# Patient Record
Sex: Male | Born: 1985
Health system: Southern US, Community
[De-identification: ages and names within clinical notes are randomized; demographics above are authoritative.]

## PROBLEM LIST (undated history)

## (undated) DIAGNOSIS — J45909 Unspecified asthma, uncomplicated: Secondary | ICD-10-CM

## (undated) HISTORY — PX: NO PAST SURGERIES: SHX2092

---
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
## 2003-08-11 ENCOUNTER — Emergency Department (HOSPITAL_COMMUNITY): Admission: AD | Admit: 2003-08-11 | Discharge: 2003-08-12 | Payer: Self-pay | Admitting: Emergency Medicine

## 2003-12-13 ENCOUNTER — Emergency Department (HOSPITAL_COMMUNITY): Admission: EM | Admit: 2003-12-13 | Discharge: 2003-12-13 | Payer: Self-pay | Admitting: Emergency Medicine
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
## 2012-12-13 ENCOUNTER — Encounter (HOSPITAL_COMMUNITY): Payer: Self-pay | Admitting: Emergency Medicine

## 2012-12-13 ENCOUNTER — Emergency Department (INDEPENDENT_AMBULATORY_CARE_PROVIDER_SITE_OTHER)
Admission: EM | Admit: 2012-12-13 | Discharge: 2012-12-13 | Disposition: A | Discharge status: Home / Self Care | Payer: Self-pay | Source: Home / Self Care | Attending: Emergency Medicine | Admitting: Emergency Medicine

## 2012-12-13 ENCOUNTER — Other Ambulatory Visit (HOSPITAL_COMMUNITY)
Admission: RE | Admit: 2012-12-13 | Discharge: 2012-12-13 | Disposition: A | Discharge status: Home / Self Care | Payer: Self-pay | Source: Ambulatory Visit | Attending: Emergency Medicine | Admitting: Emergency Medicine

## 2012-12-13 DIAGNOSIS — R319 Hematuria, unspecified: Secondary | ICD-10-CM

## 2012-12-13 DIAGNOSIS — N342 Other urethritis: Secondary | ICD-10-CM

## 2012-12-13 DIAGNOSIS — Z113 Encounter for screening for infections with a predominantly sexual mode of transmission: Secondary | ICD-10-CM | POA: Insufficient documentation

## 2012-12-13 LAB — POCT URINALYSIS DIP (DEVICE)
Bilirubin Urine: NEGATIVE
Glucose, UA: NEGATIVE mg/dL
Hgb urine dipstick: NEGATIVE
Ketones, ur: NEGATIVE mg/dL
Leukocytes, UA: NEGATIVE
Nitrite: NEGATIVE
pH: 7 (ref 5.0–8.0)

## 2012-12-13 MED ORDER — CEFTRIAXONE SODIUM 250 MG IJ SOLR
250.0000 mg | Freq: Once | INTRAMUSCULAR | Status: AC
  Administered 2012-12-13: 250 mg via INTRAMUSCULAR

## 2012-12-13 MED ORDER — CEFTRIAXONE SODIUM 250 MG IJ SOLR
INTRAMUSCULAR | Status: AC
  Filled 2012-12-13: qty 250

## 2012-12-13 MED ORDER — LIDOCAINE HCL (PF) 1 % IJ SOLN
INTRAMUSCULAR | Status: AC
  Filled 2012-12-13: qty 5

## 2012-12-13 MED ORDER — AZITHROMYCIN 250 MG PO TABS
ORAL_TABLET | ORAL | Status: AC
  Filled 2012-12-13: qty 4

## 2012-12-13 MED ORDER — AZITHROMYCIN 250 MG PO TABS
1000.0000 mg | ORAL_TABLET | Freq: Once | ORAL | Status: AC
  Administered 2012-12-13: 1000 mg via ORAL

## 2012-12-13 NOTE — ED Notes (Signed)
Patient aware of post injection delay prior to discharge.  Declined crackers and soft drinks, currently has water

## 2012-12-13 NOTE — ED Provider Notes (Signed)
Chief Complaint:   Chief Complaint  Patient presents with  . Hematuria    History of Present Illness:   Richard Salinas is a 27 year old male who has had a two-week history of intermittent blood at the tip of the urethra after urination. He's had slight burning and slight urgency. He denies any urethral discharge , frequency, or changes in his urinary stream. He's had no fever, chills, nausea, vomiting, abdominal pain, or lower back pain. He denies any testicular pain or swelling. The patient states he was at the health department 2 weeks ago and had STD testing all which was negative and he was not given any treatment at the time. He denies any history of urinary tract infections, stones, or STDs.   Review of Systems:  Other than noted above, the patient denies any of the following symptoms: General:  No fevers, chills, sweats, aches, or fatigue. GI:  No abdominal pain, back pain, nausea, vomiting, diarrhea, or constipation. GU:  No dysuria, frequency, urgency, hematuria, urethral discharge, penile lesions, penile pain, testicular pain, swelling, or mass, inguinal lymphadenopathy or incontinence.  PMFSH:  Past medical history, family history, social history, meds, and allergies were reviewed.    Physical Exam:   Vital signs:  BP 118/72  Pulse 74  Temp(Src) 98.3 F (36.8 C) (Oral)  Resp 17  SpO2 96% Gen:  Alert, oriented, in no distress. Lungs:  Clear to auscultation, no wheezes, rales or rhonchi. Heart:  Regular rhythm, no gallop or murmer. Abdomen:  Flat and soft.  No tenderness to palpation, guarding, or rebound.  No hepato-splenomegaly or mass.  Bowel sounds were normally active.  No hernia. Genital exam:   There is a yellowish urethral discharge. No lesions on the penis. Testes are normal, no inguinal lymphadenopathy.  Rectal exam:   No masses, normal prostate, no nodules, and heme-negative stool.  Back:  No CVA tenderness.  Skin:  Clear, warm and dry.  Labs:   Results for orders  placed during the hospital encounter of 12/13/12  POCT URINALYSIS DIP (DEVICE)      Result Value Range   Glucose, UA NEGATIVE  NEGATIVE mg/dL   Bilirubin Urine NEGATIVE  NEGATIVE   Ketones, ur NEGATIVE  NEGATIVE mg/dL   Specific Gravity, Urine 1.025  1.005 - 1.030   Hgb urine dipstick NEGATIVE  NEGATIVE   pH 7.0  5.0 - 8.0   Protein, ur NEGATIVE  NEGATIVE mg/dL   Urobilinogen, UA 2.0 (*) 0.0 - 1.0 mg/dL   Nitrite NEGATIVE  NEGATIVE   Leukocytes, UA NEGATIVE  NEGATIVE    Also obtained a urine culture, and DNA probes for gonorrhea, Chlamydia, Trichomonas.   Course in Urgent Care Center:   Given Rocephin 250 mg IM, and azithromycin 1000 mg by mouth.  Assessment: The primary encounter diagnosis was Hematuria. A diagnosis of Urethritis was also pertinent to this visit.    Even though he was at the health department and had negative STD testing 2 weeks ago, it appears he has gonorrhea. This may be the cause of his hematuria. I've advised him to followup with urology for further evaluation of hematuria in 2 weeks.   Plan:   1.  The following meds were prescribed:   New Prescriptions   No medications on file   2.  The patient was instructed in symptomatic care and handouts were given. 3.  The patient was told to return if becoming worse in any way. 4.  Follow up with Dr. Sebastian Ache in 2 weeks.  Reuben Likes, MD 12/13/12 [phone]

## 2012-12-13 NOTE — ED Notes (Signed)
Reports blood in urine.  Burning with urination.  Symptoms started 2-3 weeks ago.

## 2012-12-13 NOTE — ED Notes (Signed)
Not ready for discharge, post injection delay prior to discharge

## 2012-12-13 NOTE — ED Notes (Signed)
Triage process interrupted by department issue

## 2012-12-14 LAB — URINE CULTURE
Colony Count: NO GROWTH
Culture: NO GROWTH

## 2012-12-16 ENCOUNTER — Telehealth (HOSPITAL_COMMUNITY): Payer: Self-pay | Admitting: Emergency Medicine

## 2012-12-16 ENCOUNTER — Telehealth (HOSPITAL_COMMUNITY): Payer: Self-pay | Admitting: *Deleted

## 2012-12-16 MED ORDER — METRONIDAZOLE 500 MG PO TABS: ORAL_TABLET | ORAL | Status: DC

## 2012-12-16 NOTE — ED Notes (Signed)
GC/Chlamydia neg., Trich pos.  Order for Flagyl obtained.  I called pt. Pt. verified x 2 and given results.  Pt. told he needs Flagyl for Trich.  Pt. instructed to notify his partner to be treated with Flagyl, no sex until you have finished your medication and your partner has been treated and to practice safe sex. You can get HIV testing at the [facility] STD clinic, by appointment.  Pt. said he has already done that. Pt. wants Rx. called to [facility] on [address]. I called Rx. to pharmacy voicemail @ [phone]. Richard Salinas 12/16/2012

## 2012-12-16 NOTE — ED Notes (Signed)
DNA probe came back positive for Trichomonas. Will treat with metronidazole 500 mg, 2 twice a day for one day.  Reuben Likes, MD 12/16/12 [phone]

## 2012-12-16 NOTE — Telephone Encounter (Signed)
Message copied by Reuben Likes on Tue Dec 16, 2012  5:24 PM ------      Message from: Vassie Moselle      Created: Tue Dec 16, 2012  5:02 PM      Regarding: lab       Trich pos. Rest of labs neg.  Needs order.      Vassie Moselle      12/16/2012       ------

## 2012-12-17 ENCOUNTER — Telehealth (HOSPITAL_COMMUNITY): Payer: Self-pay | Admitting: *Deleted

## 2012-12-17 NOTE — ED Notes (Signed)
Pt. called back and said the he called [facility] @ 0800 and 1330 and they did not have the Rx. He wants to it called to the [facility] on [address]. I told him it should be ready in 1 hr.  I called [facility] to verify.  The pharmacist said it was not on her VM this morning.  Rx. called to the [facility] pharmacist @ [phone]. Vassie Moselle 12/17/2012

## 2012-12-18 NOTE — ED Notes (Signed)
Chart review.

## 2017-07-25 ENCOUNTER — Encounter (HOSPITAL_COMMUNITY): Payer: Self-pay | Admitting: Family Medicine

## 2017-07-25 ENCOUNTER — Ambulatory Visit (HOSPITAL_COMMUNITY)
Admission: EM | Admit: 2017-07-25 | Discharge: 2017-07-25 | Disposition: A | Discharge status: Home / Self Care | Payer: Self-pay | Attending: Family Medicine | Admitting: Family Medicine

## 2017-07-25 ENCOUNTER — Ambulatory Visit (INDEPENDENT_AMBULATORY_CARE_PROVIDER_SITE_OTHER): Payer: Self-pay

## 2017-07-25 DIAGNOSIS — M79671 Pain in right foot: Secondary | ICD-10-CM

## 2017-07-25 DIAGNOSIS — S92351D Displaced fracture of fifth metatarsal bone, right foot, subsequent encounter for fracture with routine healing: Secondary | ICD-10-CM

## 2017-07-25 MED ORDER — TRAMADOL HCL 50 MG PO TABS: 50.0000 mg | ORAL_TABLET | Freq: Four times a day (QID) | ORAL | 0 refills | Status: DC | PRN

## 2017-07-25 NOTE — ED Provider Notes (Signed)
[facility] CARE [facility]ENTER   [id number] 07/25/17 Arrival Time: 1350   SUBJECTIVE:  [name] is a 32 y.o. male who presents to the urgent care with complaint of pain in right foot that started about 30 minutes ago. He was playing with his dog and felt a pop. Able to ambulate but pain with walking. sts lateral foot pain.   History reviewed. No pertinent past medical history. History reviewed. No pertinent family history. Social History   Socioeconomic History  . Marital status: Single    Spouse name: Not on file  . Number of children: Not on file  . Years of education: Not on file  . Highest education level: Not on file  Social Needs  . Financial resource strain: Not on file  . Food insecurity - worry: Not on file  . Food insecurity - inability: Not on file  . Transportation needs - medical: Not on file  . Transportation needs - non-medical: Not on file  Occupational History  . Not on file  Tobacco Use  . Smoking status: Current Every Day Smoker  Substance and Sexual Activity  . Alcohol use: Yes  . Drug use: No  . Sexual activity: Not on file  Other Topics Concern  . Not on file  Social History Narrative  . Not on file   No outpatient medications have been marked as taking for the 07/25/17 encounter [facility] Encounter).   No Known Allergies    ROS: As per HPI, remainder of ROS negative.   OBJECTIVE:   Vitals:   07/25/17 1412  BP: 102/70  Pulse: 80  Resp: 18  Temp: 98.6 F (37 C)  SpO2: 97%     General appearance: alert; no distress Eyes: PERRL; EOMI; conjunctiva normal HENT: normocephalic; atraumatic; TMs normal, canal normal, external ears normal without trauma; nasal mucosa normal; oral mucosa normal Neck: supple Back: no CVA tenderness Extremities: no cyanosis or edema; swollen right proximal 5th metatarsal. Skin: warm and dry Neurologic: normal gait; grossly normal Psychological: alert and cooperative; normal mood and  affect      Labs:  Results for orders placed or performed during the hospital encounter of 12/13/12  Urine culture  Result Value Ref Range   Specimen Description URINE, CLEAN CATCH    Special Requests NONE    Culture  Setup Time 12/13/2012 21:32    Colony Count NO GROWTH    Culture NO GROWTH    Report Status 12/14/2012 FINAL   POCT urinalysis dip (device)  Result Value Ref Range   Glucose, UA NEGATIVE NEGATIVE mg/dL   Bilirubin Urine NEGATIVE NEGATIVE   Ketones, ur NEGATIVE NEGATIVE mg/dL   Specific Gravity, Urine 1.025 1.005 - 1.030   Hgb urine dipstick NEGATIVE NEGATIVE   pH 7.0 5.0 - 8.0   Protein, ur NEGATIVE NEGATIVE mg/dL   Urobilinogen, UA 2.0 (H) 0.0 - 1.0 mg/dL   Nitrite NEGATIVE NEGATIVE   Leukocytes, UA NEGATIVE NEGATIVE  Occult blood, poc device  Result Value Ref Range   Fecal Occult Bld NEGATIVE NEGATIVE    Labs Reviewed - No data to display  Dg Foot Complete Right  Result Date: 07/25/2017 CLINICAL DATA:  32 y/o M; lateral foot swelling after a popping sensation at base of fourth and fifth metatarsals. EXAM: RIGHT FOOT COMPLETE - 3+ VIEW COMPARISON:  None. FINDINGS: Slight cortical irregularity in the lateral base of fifth metatarsal may represent a nondisplaced fracture. Overlying soft tissue swelling. No other fracture or dislocation identified. Pes planus. IMPRESSION: 1. Slight cortical  irregularity and lateral base of fifth metatarsal may represent nondisplaced fracture. 2. Pes planus. Electronically Signed   By: Mitzi HansenLance  Furusawa-Stratton M.D.   On: 07/25/2017 14:50       ASSESSMENT & PLAN:  1. Closed fracture of base of fifth metatarsal bone with routine healing, right     Meds ordered this encounter  Medications  . traMADol (ULTRAM) 50 MG tablet    Sig: Take 1 tablet (50 mg total) by mouth every 6 (six) hours as needed.    Dispense:  15 tablet    Refill:  0    Reviewed expectations re: course of current medical issues. Questions  answered. Outlined signs and symptoms indicating need for more acute intervention. Patient verbalized understanding. After Visit Summary given.      Elvina SidleLauenstein, Adhira Jamil, MD 07/25/17 [phone]

## 2017-07-25 NOTE — Discharge Instructions (Signed)
You will need orthopedic follow up in 4-5 days.

## 2017-07-25 NOTE — ED Triage Notes (Signed)
Pt here for pain in right foot that started about 30 minutes ago. He was playing with his dog and felt a pop. Able to ambulate but pain with walking. sts lateral foot pain.

## 2018-08-06 ENCOUNTER — Emergency Department (HOSPITAL_COMMUNITY)
Admission: EM | Admit: 2018-08-06 | Discharge: 2018-08-06 | Disposition: A | Discharge status: Home / Self Care | Payer: BLUE CROSS/BLUE SHIELD | Attending: Emergency Medicine | Admitting: Emergency Medicine

## 2018-08-06 ENCOUNTER — Other Ambulatory Visit: Payer: Self-pay

## 2018-08-06 ENCOUNTER — Encounter (HOSPITAL_COMMUNITY): Payer: Self-pay

## 2018-08-06 DIAGNOSIS — R197 Diarrhea, unspecified: Secondary | ICD-10-CM | POA: Diagnosis not present

## 2018-08-06 DIAGNOSIS — J45909 Unspecified asthma, uncomplicated: Secondary | ICD-10-CM | POA: Insufficient documentation

## 2018-08-06 DIAGNOSIS — R05 Cough: Secondary | ICD-10-CM | POA: Diagnosis not present

## 2018-08-06 DIAGNOSIS — F17200 Nicotine dependence, unspecified, uncomplicated: Secondary | ICD-10-CM | POA: Diagnosis not present

## 2018-08-06 DIAGNOSIS — R059 Cough, unspecified: Secondary | ICD-10-CM

## 2018-08-06 HISTORY — DX: Unspecified asthma, uncomplicated: J45.909

## 2018-08-06 MED ORDER — BENZONATATE 100 MG PO CAPS: 100.0000 mg | ORAL_CAPSULE | Freq: Three times a day (TID) | ORAL | 0 refills | Status: DC | PRN

## 2018-08-06 NOTE — ED Notes (Signed)
Patient verbalizes understanding of medications and discharge instructions. No further questions at this time. VSS and patient ambulatory at discharge.   

## 2018-08-06 NOTE — ED Provider Notes (Signed)
[facility] EMERGENCY DEPARTMENT Provider Note   CSN: [id number] Arrival date & time: 08/06/18  2033    History   Chief Complaint Chief Complaint  Patient presents with  . Cough    HPI Richard Salinas is a 33 y.o. male with history of asthma presenting for evaluation of acute onset, persistent cough and diarrhea for 1 week.  He reports that his 51-month-old daughter has similar symptoms that began at the same time.  He denies any fevers or chills, chest pain, shortness of breath, abdominal pain, nausea, vomiting, urinary symptoms, sore throat, or nasal congestion.  Cough is sometimes productive of clear sputum.  Has had 1-3 episodes of softer stools but no melena or hematochezia.  He currently smokes 1 black and mild cigar daily.  Denies recreational drug use otherwise.  Has not tried anything for his symptoms.  He denies any recent travel or direct exposure with someone who tested positive for COVID-19.  He does work at [employer].     The history is provided by the patient.    Past Medical History:  Diagnosis Date  . Asthma     There are no active problems to display for this patient.   History reviewed. No pertinent surgical history.      Home Medications    Prior to Admission medications   Medication Sig Start Date End Date Taking? Authorizing Provider  benzonatate (TESSALON) 100 MG capsule Take 1 capsule (100 mg total) by mouth 3 (three) times daily as needed for cough. 08/06/18   Giulliana Mcroberts A, PA-C  traMADol (ULTRAM) 50 MG tablet Take 1 tablet (50 mg total) by mouth every 6 (six) hours as needed. 07/25/17   Elvina Sidle, MD    Family History No family history on file.  Social History Social History   Tobacco Use  . Smoking status: Current Every Day Smoker  Substance Use Topics  . Alcohol use: Yes  . Drug use: No     Allergies   Patient has no known allergies.   Review of Systems Review of Systems   Constitutional: Negative for chills and fever.  HENT: Negative for congestion and sore throat.   Respiratory: Positive for cough. Negative for shortness of breath.   Cardiovascular: Negative for chest pain.  Gastrointestinal: Positive for diarrhea. Negative for abdominal pain, blood in stool, nausea and vomiting.  All other systems reviewed and are negative.    Physical Exam Updated Vital Signs BP 115/74 (BP Location: Right Arm)   Pulse 92   Temp 98.7 F (37.1 C) (Oral)   Resp 18   Ht 6\' 1"  (1.854 m)   Wt 70.3 kg   SpO2 97%   BMI 20.45 kg/m   Physical Exam Vitals signs and nursing note reviewed.  Constitutional:      General: He is not in acute distress.    Appearance: He is well-developed.     Comments: Resting comfortably in bed  HENT:     Head: Normocephalic and atraumatic.     Right Ear: Tympanic membrane, ear canal and external ear normal.     Left Ear: Tympanic membrane, ear canal and external ear normal.     Nose: Nose normal. No congestion or rhinorrhea.     Mouth/Throat:     Mouth: Mucous membranes are moist.     Pharynx: Oropharynx is clear. No oropharyngeal exudate or posterior oropharyngeal erythema.     Comments: No tonsillar hypertrophy, exudates, erythema, trismus or sublingual abnormalities.  Eyes:     General:        Right eye: No discharge.        Left eye: No discharge.     Conjunctiva/sclera: Conjunctivae normal.  Neck:     Musculoskeletal: Normal range of motion and neck supple.     Vascular: No JVD.     Trachea: No tracheal deviation.  Cardiovascular:     Rate and Rhythm: Normal rate and regular rhythm.     Pulses: Normal pulses.     Heart sounds: Normal heart sounds.  Pulmonary:     Effort: Pulmonary effort is normal. No respiratory distress.     Breath sounds: Normal breath sounds. No stridor. No wheezing or rhonchi.     Comments: Speaking in full sentences without difficulty. Abdominal:     General: Abdomen is flat. There is no  distension.     Tenderness: There is no abdominal tenderness. There is no guarding or rebound.  Lymphadenopathy:     Cervical: No cervical adenopathy.  Skin:    General: Skin is warm and dry.     Findings: No erythema.  Neurological:     Mental Status: He is alert.  Psychiatric:        Behavior: Behavior normal.      ED Treatments / Results  Labs (all labs ordered are listed, but only abnormal results are displayed) Labs Reviewed - No data to display  EKG None  Radiology No results found.  Procedures Procedures (including critical care time)  Medications Ordered in ED Medications - No data to display   Initial Impression / Assessment and Plan / ED Course  I have reviewed the triage vital signs and the nursing notes.  Pertinent labs & imaging results that were available during my care of the patient were reviewed by me and considered in my medical decision making (see chart for details).        Patient presenting for evaluation of cough and diarrhea for 1 week.  He is afebrile, vital signs are stable.  He is nontoxic in appearance.  Lungs clear to auscultation bilaterally.  He reports that the cough is minimally productive.  Denies any shortness of breath.  Low suspicion of pneumonia.  Abdomen is soft nontender, doubt acute surgical abdominal pathology. He expresses some concerns that he may be positive for COVID-19, but is overall low risk and does not meet criteria for testing per current [facility] policy.  I recommended self quarantine for the next 14 days and patient is agreeable to this.  We discussed symptomatic management.  Discussed strict ED return precautions. Pt verbalized understanding of and agreement with plan and is safe for discharge home at this time.   Final Clinical Impressions(s) / ED Diagnoses   Final diagnoses:  Cough  Diarrhea of presumed infectious origin    ED Discharge Orders         Ordered    benzonatate (TESSALON) 100 MG capsule  3  times daily PRN     08/06/18 2128           Jeanie Sewer, PA-C 08/06/18 2135    Little, Ambrose Finland, MD 08/07/18 [phone]

## 2018-08-06 NOTE — Discharge Instructions (Addendum)
Drink plenty of water and get plenty of rest.  Use Tessalon as needed for cough.  Avoid smoking.  Follow-up with PCP if symptoms persist.  You should isolate yourself at home for the next 2 weeks.  See the below recommendations.  Return to the emergency department if any concerning signs or symptoms develop such as fevers, shortness of breath, severe cough, abdominal pain, persistent vomiting, or passing out.     Person Under Monitoring Name: Richard Salinas  Location: [address] [place] [address]   Infection Prevention Recommendations for Individuals Confirmed to have, or Being Evaluated for, 2019 Novel Coronavirus (COVID-19) Infection Who Receive Care at Home  Individuals who are confirmed to have, or are being evaluated for, COVID-19 should follow the prevention steps below until a healthcare provider or local or state health department says they can return to normal activities.  Stay home except to get medical care You should restrict activities outside your home, except for getting medical care. Do not go to work, school, or public areas, and do not use public transportation or taxis.  Call ahead before visiting your doctor Before your medical appointment, call the healthcare provider and tell them that you have, or are being evaluated for, COVID-19 infection. This will help the healthcare providers office take steps to keep other people from getting infected. Ask your healthcare provider to call the local or state health department.  Monitor your symptoms Seek prompt medical attention if your illness is worsening (e.g., difficulty breathing). Before going to your medical appointment, call the healthcare provider and tell them that you have, or are being evaluated for, COVID-19 infection. Ask your healthcare provider to call the local or state health department.  Wear a facemask You should wear a facemask that covers your nose and mouth when you are in the same  room with other people and when you visit a healthcare provider. People who live with or visit you should also wear a facemask while they are in the same room with you.  Separate yourself from other people in your home As much as possible, you should stay in a different room from other people in your home. Also, you should use a separate bathroom, if available.  Avoid sharing household items You should not share dishes, drinking glasses, cups, eating utensils, towels, bedding, or other items with other people in your home. After using these items, you should wash them thoroughly with soap and water.  Cover your coughs and sneezes Cover your mouth and nose with a tissue when you cough or sneeze, or you can cough or sneeze into your sleeve. Throw used tissues in a lined trash can, and immediately wash your hands with soap and water for at least 20 seconds or use an alcohol-based hand rub.  Wash your [employer] your hands often and thoroughly with soap and water for at least 20 seconds. You can use an alcohol-based hand sanitizer if soap and water are not available and if your hands are not visibly dirty. Avoid touching your eyes, nose, and mouth with unwashed hands.   Prevention Steps for Caregivers and Household Members of Individuals Confirmed to have, or Being Evaluated for, COVID-19 Infection Being Cared for in the Home  If you live with, or provide care at home for, a person confirmed to have, or being evaluated for, COVID-19 infection please follow these guidelines to prevent infection:  Follow healthcare providers instructions Make sure that you understand and can help the patient  follow any healthcare provider instructions for all care.  Provide for the patients basic needs You should help the patient with basic needs in the home and provide support for getting groceries, prescriptions, and other personal needs.  Monitor the patients symptoms If they are getting sicker,  call his or her medical provider and tell them that the patient has, or is being evaluated for, COVID-19 infection. This will help the healthcare providers office take steps to keep other people from getting infected. Ask the healthcare provider to call the local or state health department.  Limit the number of people who have contact with the patient If possible, have only one caregiver for the patient. Other household members should stay in another home or place of residence. If this is not possible, they should stay in another room, or be separated from the patient as much as possible. Use a separate bathroom, if available. Restrict visitors who do not have an essential need to be in the home.  Keep older adults, very young children, and other sick people away from the patient Keep older adults, very young children, and those who have compromised immune systems or chronic health conditions away from the patient. This includes people with chronic heart, lung, or kidney conditions, diabetes, and cancer.  Ensure good ventilation Make sure that shared spaces in the home have good air flow, such as from an air conditioner or an opened window, weather permitting.  Wash your hands often Wash your hands often and thoroughly with soap and water for at least 20 seconds. You can use an alcohol based hand sanitizer if soap and water are not available and if your hands are not visibly dirty. Avoid touching your eyes, nose, and mouth with unwashed hands. Use disposable paper towels to dry your hands. If not available, use dedicated cloth towels and replace them when they become wet.  Wear a facemask and gloves Wear a disposable facemask at all times in the room and gloves when you touch or have contact with the patients blood, body fluids, and/or secretions or excretions, such as sweat, saliva, sputum, nasal mucus, vomit, urine, or feces.  Ensure the mask fits over your nose and mouth tightly, and do  not touch it during use. Throw out disposable facemasks and gloves after using them. Do not reuse. Wash your hands immediately after removing your facemask and gloves. If your personal clothing becomes contaminated, carefully remove clothing and launder. Wash your hands after handling contaminated clothing. Place all used disposable facemasks, gloves, and other waste in a lined container before disposing them with other household waste. Remove gloves and wash your hands immediately after handling these items.  Do not share dishes, glasses, or other household items with the patient Avoid sharing household items. You should not share dishes, drinking glasses, cups, eating utensils, towels, bedding, or other items with a patient who is confirmed to have, or being evaluated for, COVID-19 infection. After the person uses these items, you should wash them thoroughly with soap and water.  Wash laundry thoroughly Immediately remove and wash clothes or bedding that have blood, body fluids, and/or secretions or excretions, such as sweat, saliva, sputum, nasal mucus, vomit, urine, or feces, on them. Wear gloves when handling laundry from the patient. Read and follow directions on labels of laundry or clothing items and detergent. In general, wash and dry with the warmest temperatures recommended on the label.  Clean all areas the individual has used often Clean all touchable surfaces, such as  counters, tabletops, doorknobs, bathroom fixtures, toilets, phones, keyboards, tablets, and bedside tables, every day. Also, clean any surfaces that may have blood, body fluids, and/or secretions or excretions on them. Wear gloves when cleaning surfaces the patient has come in contact with. Use a diluted bleach solution (e.g., dilute bleach with 1 part bleach and 10 parts water) or a household disinfectant with a label that says EPA-registered for coronaviruses. To make a bleach solution at home, add 1 tablespoon of  bleach to 1 quart (4 cups) of water. For a larger supply, add  cup of bleach to 1 gallon (16 cups) of water. Read labels of cleaning products and follow recommendations provided on product labels. Labels contain instructions for safe and effective use of the cleaning product including precautions you should take when applying the product, such as wearing gloves or eye protection and making sure you have good ventilation during use of the product. Remove gloves and wash hands immediately after cleaning.  Monitor yourself for signs and symptoms of illness Caregivers and household members are considered close contacts, should monitor their health, and will be asked to limit movement outside of the home to the extent possible. Follow the monitoring steps for close contacts listed on the symptom monitoring form.   ? If you have additional questions, contact your local health department or call the epidemiologist on call at [phone] (available 24/7). ? This guidance is subject to change. For the most up-to-date guidance from [facility], please refer to their website: [email or web address]

## 2018-08-06 NOTE — ED Triage Notes (Signed)
Patient reports productive cough and diarrhea x 1 week. 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 Denies nausea, vomiting, chest pain, abdominal pain, dysuria, or fever.

## 2019-02-10 ENCOUNTER — Encounter (HOSPITAL_COMMUNITY): Payer: Self-pay | Admitting: Emergency Medicine

## 2019-02-10 ENCOUNTER — Other Ambulatory Visit: Payer: Self-pay

## 2019-02-10 ENCOUNTER — Ambulatory Visit (HOSPITAL_COMMUNITY)
Admission: EM | Admit: 2019-02-10 | Discharge: 2019-02-10 | Disposition: A | Discharge status: Home / Self Care | Payer: BC Managed Care – PPO | Attending: Family Medicine | Admitting: Family Medicine

## 2019-02-10 DIAGNOSIS — K529 Noninfective gastroenteritis and colitis, unspecified: Secondary | ICD-10-CM

## 2019-02-10 MED ORDER — ONDANSETRON 4 MG PO TBDP: 4.0000 mg | ORAL_TABLET | Freq: Three times a day (TID) | ORAL | 0 refills | Status: DC | PRN

## 2019-02-10 NOTE — ED Provider Notes (Signed)
[facility]   [id number] 02/10/19 Arrival Time: 3235  ASSESSMENT & PLAN:  1. Gastroenteritis     No signs of dehydration requiring IVF at this time. Benign abdominal exam. No indication for urgent abdominal imaging. Discussed.  Meds ordered this encounter  Medications  . ondansetron (ZOFRAN-ODT) 4 MG disintegrating tablet    Sig: Take 1 tablet (4 mg total) by mouth every 8 (eight) hours as needed for nausea or vomiting.    Dispense:  15 tablet    Refill:  0    Overall he reports improvement. Work note provided. Discussed typical duration of symptoms for suspected viral GI illness. Will do his best to ensure adequate fluid intake in order to avoid dehydration. Will proceed to the Emergency Department for evaluation if unable to tolerate PO fluids regularly.  Follow-up Information    [facility].   Specialty: Urgent Care Why: If worsening or failing to improve as anticipated. Contact information: [place] [phone]          Reviewed expectations re: course of current medical issues. Questions answered. Outlined signs and symptoms indicating need for more acute intervention. Patient verbalized understanding. After Visit Summary given.   SUBJECTIVE: History from: patient.  Richard Salinas is a 33 y.o. male who presents with complaint of non-bilious, non-bloody intermittent n/v with non-bloody diarrhea. Abrupt onset approx 48 hours ago. Abdominal discomfort: mild and cramping. Symptoms are gradually improving since beginning. Aggravating factors: eating. Alleviating factors: none identified. Associated symptoms: fatigue. He denies arthralgias, chills, constipation, dysuria, fever, myalgias and sweats. Appetite: decreased. PO intake: decreased but tolerating fluids today. Ambulatory without assistance. Urinary symptoms: none. Sick contacts: none. Recent travel or camping: none. OTC  treatment: none.  History reviewed. No pertinent surgical history.  ROS: As per HPI. All other systems negative.   OBJECTIVE:  Vitals:   02/10/19 1658  BP: 117/72  Pulse: 78  Resp: 16  Temp: 98.7 F (37.1 C)  TempSrc: Oral  SpO2: 98%    General appearance: alert; no distress Oropharynx: moist Lungs: clear to auscultation bilaterally; unlabored Heart: regular rate and rhythm Abdomen: soft; non-distended; no significant abdominal tenderness; reports "cramping" feeling with palpation; bowel sounds present; no masses or organomegaly; no guarding or rebound tenderness Back: no CVA tenderness Extremities: no edema; symmetrical with no gross deformities Skin: warm; dry Neurologic: normal gait Psychological: alert and cooperative; normal mood and affect   No Known Allergies                                             Past Medical History:  Diagnosis Date  . Asthma    Social History   Socioeconomic History  . Marital status: Single    Spouse name: Not on file  . Number of children: Not on file  . Years of education: Not on file  . Highest education level: Not on file  Occupational History  . Not on file  Social Needs  . Financial resource strain: Not on file  . Food insecurity    Worry: Not on file    Inability: Not on file  . Transportation needs    Medical: Not on file    Non-medical: Not on file  Tobacco Use  . Smoking status: Current Every Day Smoker  Substance and Sexual Activity  . Alcohol use: Yes  .  Drug use: No  . Sexual activity: Not on file  Lifestyle  . Physical activity    Days per week: Not on file    Minutes per session: Not on file  . Stress: Not on file  Relationships  . Social Musician on phone: Not on file    Gets together: Not on file    Attends religious service: Not on file    Active member of club or organization: Not on file    Attends meetings of clubs or organizations: Not on file    Relationship status: Not on file   . Intimate partner violence    Fear of current or ex partner: Not on file    Emotionally abused: Not on file    Physically abused: Not on file    Forced sexual activity: Not on file  Other Topics Concern  . Not on file  Social History Narrative  . Not on file     Mardella Layman, MD 02/10/19 [phone]

## 2019-02-10 NOTE — Discharge Instructions (Addendum)

## 2019-02-10 NOTE — ED Triage Notes (Signed)
PT reports vomiting and bloating. Diarrhea and abdominal pain as well. PT has vomited 10 x in last 24 hours.   Denies headache, sore throat, chills, bodyaches, no loss of taste or smell.

## 2019-04-26 ENCOUNTER — Encounter (HOSPITAL_COMMUNITY): Payer: Self-pay

## 2019-04-26 ENCOUNTER — Ambulatory Visit (HOSPITAL_COMMUNITY)
Admission: EM | Admit: 2019-04-26 | Discharge: 2019-04-26 | Disposition: A | Discharge status: Home / Self Care | Payer: BC Managed Care – PPO | Attending: Urgent Care | Admitting: Urgent Care

## 2019-04-26 ENCOUNTER — Other Ambulatory Visit: Payer: Self-pay

## 2019-04-26 DIAGNOSIS — R0602 Shortness of breath: Secondary | ICD-10-CM | POA: Diagnosis present

## 2019-04-26 DIAGNOSIS — J4520 Mild intermittent asthma, uncomplicated: Secondary | ICD-10-CM

## 2019-04-26 DIAGNOSIS — R062 Wheezing: Secondary | ICD-10-CM | POA: Diagnosis present

## 2019-04-26 DIAGNOSIS — R05 Cough: Secondary | ICD-10-CM | POA: Insufficient documentation

## 2019-04-26 DIAGNOSIS — R059 Cough, unspecified: Secondary | ICD-10-CM

## 2019-04-26 DIAGNOSIS — Z20828 Contact with and (suspected) exposure to other viral communicable diseases: Secondary | ICD-10-CM | POA: Diagnosis not present

## 2019-04-26 DIAGNOSIS — F17210 Nicotine dependence, cigarettes, uncomplicated: Secondary | ICD-10-CM | POA: Diagnosis not present

## 2019-04-26 DIAGNOSIS — J069 Acute upper respiratory infection, unspecified: Secondary | ICD-10-CM | POA: Diagnosis present

## 2019-04-26 MED ORDER — ALBUTEROL SULFATE HFA 108 (90 BASE) MCG/ACT IN AERS
2.0000 | INHALATION_SPRAY | Freq: Once | RESPIRATORY_TRACT | Status: AC
  Administered 2019-04-26: 2 via RESPIRATORY_TRACT

## 2019-04-26 MED ORDER — BENZONATATE 100 MG PO CAPS: 100.0000 mg | ORAL_CAPSULE | Freq: Three times a day (TID) | ORAL | 0 refills | Status: DC | PRN

## 2019-04-26 MED ORDER — PROMETHAZINE-DM 6.25-15 MG/5ML PO SYRP: 5.0000 mL | ORAL_SOLUTION | Freq: Every evening | ORAL | 0 refills | Status: DC | PRN

## 2019-04-26 MED ORDER — ALBUTEROL SULFATE HFA 108 (90 BASE) MCG/ACT IN AERS
INHALATION_SPRAY | RESPIRATORY_TRACT | Status: AC
  Filled 2019-04-26: qty 6.7

## 2019-04-26 NOTE — ED Provider Notes (Signed)
[facility]   MRN: [id number] DOB: Sep 09, 1985  Subjective:   Richard Salinas is a 33 y.o. male presenting for 3-day history of acute onset mild to moderate recurrent shortness of breath and wheezing.  Patient works for [employer], denies any known COVID-19 contacts.  States that he has practice social distancing well.  He does smoke cigarettes daily.  Denies any family members with illness.  Denies taking chronic medications.   No Known Allergies  Past Medical History:  Diagnosis Date  . Asthma      History reviewed. No pertinent surgical history.  Family History  Problem Relation Age of Onset  . Healthy Mother   . Healthy Father     Social History   Tobacco Use  . Smoking status: Current Every Day Smoker  . Smokeless tobacco: Former Network engineer Use Topics  . Alcohol use: Yes  . Drug use: No    Review of Systems  Constitutional: Negative for fever and malaise/fatigue.  HENT: Negative for congestion, ear pain, sinus pain and sore throat.   Eyes: Negative for discharge and redness.  Respiratory: Positive for cough (mild, occasional), shortness of breath and wheezing. Negative for hemoptysis.   Cardiovascular: Negative for chest pain.  Gastrointestinal: Negative for abdominal pain, diarrhea, nausea and vomiting.  Genitourinary: Negative for dysuria, flank pain and hematuria.  Musculoskeletal: Negative for myalgias.  Skin: Negative for rash.  Neurological: Negative for dizziness, weakness and headaches.  Psychiatric/Behavioral: Negative for depression and substance abuse.     Objective:   Vitals: BP 128/79 (BP Location: Right Arm)   Pulse 92   Temp 98.2 F (36.8 C) (Oral)   Resp 16   Wt 150 lb (68 kg)   SpO2 100%   BMI 19.79 kg/m   Physical Exam Constitutional:      General: He is not in acute distress.    Appearance: Normal appearance. He is well-developed and normal weight. He is not ill-appearing, toxic-appearing or diaphoretic.  HENT:   Head: Normocephalic and atraumatic.     Right Ear: Tympanic membrane, ear canal and external ear normal. There is no impacted cerumen.     Left Ear: Tympanic membrane, ear canal and external ear normal. There is no impacted cerumen.     Nose: Nose normal. No congestion or rhinorrhea.     Mouth/Throat:     Mouth: Mucous membranes are moist.     Pharynx: Oropharynx is clear. No oropharyngeal exudate or posterior oropharyngeal erythema.  Eyes:     General: No scleral icterus.       Right eye: No discharge.        Left eye: No discharge.     Extraocular Movements: Extraocular movements intact.     Conjunctiva/sclera: Conjunctivae normal.     Pupils: Pupils are equal, round, and reactive to light.  Neck:     Musculoskeletal: Normal range of motion and neck supple. No neck rigidity or muscular tenderness.  Cardiovascular:     Rate and Rhythm: Normal rate and regular rhythm.     Heart sounds: Normal heart sounds. No murmur. No friction rub. No gallop.   Pulmonary:     Effort: Pulmonary effort is normal. No respiratory distress.     Breath sounds: No stridor. Wheezing (mid-lower lung fields bilaterally) present. No rhonchi or rales.  Neurological:     General: No focal deficit present.     Mental Status: He is alert and oriented to person, place, and time.  Psychiatric:  Mood and Affect: Mood normal.        Behavior: Behavior normal.        Thought Content: Thought content normal.        Judgment: Judgment normal.      Assessment and Plan :   1. Viral upper respiratory tract infection   2. Shortness of breath   3. Wheezing   4. Cough   5. Mild intermittent asthma without complication     Will manage for viral illness such as viral URI, viral rhinitis, possible COVID-19. Counseled patient on nature of COVID-19 including modes of transmission, diagnostic testing, management and supportive care.  Offered symptomatic relief, refilled albuterol inhaler. COVID 19 testing is  pending.  Emphasized need to use inhaler more consistently given that he is high risk to have complications should he have COVID-19.  Counseled patient on potential for adverse effects with medications prescribed/recommended today, ER and return-to-clinic precautions discussed, patient verbalized understanding.     Wallis Bamberg, PA-C 04/26/19 1258

## 2019-04-26 NOTE — ED Triage Notes (Signed)
Pt states he needs some asthma meds. Pt states his asthma is flaring 3 days.

## 2019-04-26 NOTE — Discharge Instructions (Addendum)
We will manage this as a viral syndrome. For sore throat or cough try using a honey-based tea. Use 3 teaspoons of honey with juice squeezed from half lemon. Place shaved pieces of ginger into 1/2-1 cup of water and warm over stove top. Then mix the ingredients and repeat every 4 hours as needed. Please take Tylenol 500mg every 6 hours. Hydrate very well with at least 2 liters of water. Eat light meals such as soups to replenish electrolytes and soft fruits, veggies. Start an antihistamine like Zyrtec, Allegra or Claritin for postnasal drainage, sinus congestion.  You can take this together with pseudoephedrine (Sudafed) at a dose of 60 mg 3 times a day or 120 mg twice daily as needed for the same kind of congestion.    

## 2019-04-27 LAB — NOVEL CORONAVIRUS, NAA (HOSP ORDER, SEND-OUT TO REF LAB; TAT 18-24 HRS): SARS-CoV-2, NAA: NOT DETECTED

## 2020-02-05 ENCOUNTER — Emergency Department (HOSPITAL_COMMUNITY)
Admission: EM | Admit: 2020-02-05 | Discharge: 2020-02-06 | Disposition: A | Discharge status: Home / Self Care | Payer: BC Managed Care – PPO | Attending: Emergency Medicine | Admitting: Emergency Medicine

## 2020-02-05 ENCOUNTER — Other Ambulatory Visit: Payer: Self-pay

## 2020-02-05 ENCOUNTER — Encounter (HOSPITAL_COMMUNITY): Payer: Self-pay | Admitting: Emergency Medicine

## 2020-02-05 DIAGNOSIS — Z5321 Procedure and treatment not carried out due to patient leaving prior to being seen by health care provider: Secondary | ICD-10-CM | POA: Insufficient documentation

## 2020-02-05 DIAGNOSIS — W19XXXA Unspecified fall, initial encounter: Secondary | ICD-10-CM

## 2020-02-05 DIAGNOSIS — M79672 Pain in left foot: Secondary | ICD-10-CM | POA: Insufficient documentation

## 2020-02-05 DIAGNOSIS — R2242 Localized swelling, mass and lump, left lower limb: Secondary | ICD-10-CM | POA: Insufficient documentation
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
## 2020-02-06 ENCOUNTER — Ambulatory Visit (HOSPITAL_COMMUNITY)
Admission: EM | Admit: 2020-02-06 | Discharge: 2020-02-06 | Disposition: A | Discharge status: Home / Self Care | Payer: BC Managed Care – PPO | Attending: Family Medicine | Admitting: Family Medicine

## 2020-02-06 ENCOUNTER — Emergency Department (HOSPITAL_COMMUNITY): Payer: BC Managed Care – PPO

## 2020-02-06 ENCOUNTER — Encounter (HOSPITAL_COMMUNITY): Payer: Self-pay | Admitting: Emergency Medicine

## 2020-02-06 ENCOUNTER — Other Ambulatory Visit: Payer: Self-pay

## 2020-02-06 DIAGNOSIS — M25472 Effusion, left ankle: Secondary | ICD-10-CM

## 2020-02-06 DIAGNOSIS — S92355A Nondisplaced fracture of fifth metatarsal bone, left foot, initial encounter for closed fracture: Secondary | ICD-10-CM

## 2020-02-06 DIAGNOSIS — S99922A Unspecified injury of left foot, initial encounter: Secondary | ICD-10-CM

## 2020-02-06 MED ORDER — TRAMADOL HCL 50 MG PO TABS: 50.0000 mg | ORAL_TABLET | Freq: Three times a day (TID) | ORAL | 0 refills | Status: AC | PRN

## 2020-02-06 MED ORDER — TRAMADOL HCL 50 MG PO TABS: 50.0000 mg | ORAL_TABLET | Freq: Three times a day (TID) | ORAL | 0 refills | Status: DC | PRN

## 2020-02-06 MED ORDER — IBUPROFEN 800 MG PO TABS: 800.0000 mg | ORAL_TABLET | Freq: Three times a day (TID) | ORAL | 0 refills | Status: DC

## 2020-02-06 NOTE — ED Notes (Signed)
Pt step outside 
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
## 2020-02-06 NOTE — Discharge Instructions (Addendum)
Follow-up at [facility]  on tomorrow during the walk-in clinic from 10 AM to 2 PM.  An appointment not necessary you can just walk-in.  Would like further evaluation of the pain and swelling you are having in your left foot especially with the pain at the region of your Achilles tendon concern for possible Achilles tear.  Remain nonweightbearing until otherwise directed by orthopedic.  I am prescribing you ibuprofen 800 mg to take 3 times daily as needed for pain along with a very short course of tramadol for pain.  In line with the [place] controlled substance dispensary rules  tramadol cannot be refilled as this is only given for short course for acute injuries.
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
## 2020-02-06 NOTE — ED Notes (Signed)
Pt told the risk of leaving. Pt said he could no longer wait

## 2020-06-16 ENCOUNTER — Encounter (HOSPITAL_COMMUNITY): Payer: Self-pay

## 2020-06-16 ENCOUNTER — Ambulatory Visit (HOSPITAL_COMMUNITY)
Admission: EM | Admit: 2020-06-16 | Discharge: 2020-06-16 | Disposition: A | Discharge status: Home / Self Care | Payer: Self-pay | Attending: Family Medicine | Admitting: Family Medicine

## 2020-06-16 ENCOUNTER — Other Ambulatory Visit: Payer: Self-pay

## 2020-06-16 DIAGNOSIS — N5089 Other specified disorders of the male genital organs: Secondary | ICD-10-CM

## 2020-06-16 NOTE — ED Provider Notes (Signed)
[facility]    CSN: [id number] Arrival date & time: 06/16/20  1730      History   Chief Complaint Chief Complaint  Patient presents with  . Scrotal Swelling & Pain     HPI Richard Salinas is a 35 y.o. male.   HPI   Patient states that he has had swelling in his scrotum for about a week.  He states it is not painful.  No discharge.  No drainage.  No dysuria.  He has been exposed to possible STD.  States is otherwise well  Past Medical History:  Diagnosis Date  . Asthma     There are no problems to display for this patient.   History reviewed. No pertinent surgical history.     Home Medications    Prior to Admission medications   Medication Sig Start Date End Date Taking? Authorizing Provider  benzonatate (TESSALON) 100 MG capsule Take 1-2 capsules (100-200 mg total) by mouth 3 (three) times daily as needed for cough. 04/26/19   Wallis Bamberg, PA-C  ibuprofen (ADVIL) 800 MG tablet Take 1 tablet (800 mg total) by mouth 3 (three) times daily. 02/06/20   Bing Neighbors, FNP  ondansetron (ZOFRAN-ODT) 4 MG disintegrating tablet Take 1 tablet (4 mg total) by mouth every 8 (eight) hours as needed for nausea or vomiting. 02/10/19   Mardella Layman, MD  promethazine-dextromethorphan (PROMETHAZINE-DM) 6.25-15 MG/5ML syrup Take 5 mLs by mouth at bedtime as needed for cough. 04/26/19   Wallis Bamberg, PA-C    Family History Family History  Problem Relation Age of Onset  . Healthy Mother   . Healthy Father     Social History Social History   Tobacco Use  . Smoking status: Current Every Day Smoker  . Smokeless tobacco: Former Engineer, water Use Topics  . Alcohol use: Yes  . Drug use: No     Allergies   Patient has no known allergies.   Review of Systems Review of Systems See HPI  Physical Exam Triage Vital Signs ED Triage Vitals  Enc Vitals Group     BP 06/16/20 1747 121/75     Pulse Rate 06/16/20 1747 91     Resp 06/16/20 1747 18     Temp 06/16/20  1747 99.6 F (37.6 C)     Temp Source 06/16/20 1747 Oral     SpO2 06/16/20 1747 98 %     Weight --      Height --      Head Circumference --      Peak Flow --      Pain Score 06/16/20 1746 7     Pain Loc --      Pain Edu? --      Excl. in GC? --    No data found.  Updated Vital Signs BP 121/75 (BP Location: Right Arm)   Pulse 91   Temp 99.6 F (37.6 C) (Oral)   Resp 18   SpO2 98%     Physical Exam Constitutional:      General: He is not in acute distress.    Appearance: He is well-developed and well-nourished.  HENT:     Head: Normocephalic and atraumatic.     Mouth/Throat:     Mouth: Oropharynx is clear and moist.  Eyes:     Conjunctiva/sclera: Conjunctivae normal.     Pupils: Pupils are equal, round, and reactive to light.  Cardiovascular:     Rate and Rhythm: Normal rate.  Pulmonary:  Effort: Pulmonary effort is normal. No respiratory distress.  Abdominal:     General: There is no distension.     Palpations: Abdomen is soft.  Genitourinary:   Musculoskeletal:        General: No edema. Normal range of motion.     Cervical back: Normal range of motion.  Skin:    General: Skin is warm and dry.  Neurological:     Mental Status: He is alert.      UC Treatments / Results  Labs (all labs ordered are listed, but only abnormal results are displayed) Labs Reviewed  CYTOLOGY, (ORAL, ANAL, URETHRAL) ANCILLARY ONLY    EKG   Radiology No results found.  Procedures Procedures (including critical care time)  Medications Ordered in UC Medications - No data to display  Initial Impression / Assessment and Plan / UC Course  I have reviewed the triage vital signs and the nursing notes.  Pertinent labs & imaging results that were available during my care of the patient were reviewed by me and considered in my medical decision making (see chart for details).     I called the department of urology and spoke with the resident on call.  She kindly agrees  to reach out to patient to get an ultrasound done of the next few days. Final Clinical Impressions(s) / UC Diagnoses   Final diagnoses:  Scrotal mass     Discharge Instructions     Someone in the department of urology will contact you They have a clinic every Thursday for people without health insurance If you have not heard from them, please call [phone]                                                                   Or [phone] A nurse will call you if any of your tests are positive for infection    ED Prescriptions    None     PDMP not reviewed this encounter.   Eustace Moore, MD 06/16/20 [phone]

## 2020-06-16 NOTE — Discharge Instructions (Addendum)
Someone in the department of urology will contact you They have a clinic every Thursday for people without health insurance If you have not heard from them, please call [phone]                                                                   Or [phone] A nurse will call you if any of your tests are positive for infection

## 2020-06-16 NOTE — ED Triage Notes (Signed)
Pt presents with scrotal swelling and pain from unknown source since Sunday.

## 2020-06-17 LAB — CYTOLOGY, (ORAL, ANAL, URETHRAL) ANCILLARY ONLY
Chlamydia: POSITIVE — AB
Comment: NEGATIVE
Comment: NEGATIVE
Comment: NORMAL
Neisseria Gonorrhea: NEGATIVE
Trichomonas: POSITIVE — AB

## 2020-06-20 ENCOUNTER — Telehealth (HOSPITAL_COMMUNITY): Payer: Self-pay | Admitting: Emergency Medicine

## 2020-06-20 MED ORDER — METRONIDAZOLE 500 MG PO TABS: 500.0000 mg | ORAL_TABLET | Freq: Two times a day (BID) | ORAL | 0 refills | Status: DC

## 2020-06-20 MED ORDER — DOXYCYCLINE HYCLATE 100 MG PO CAPS: 100.0000 mg | ORAL_CAPSULE | Freq: Two times a day (BID) | ORAL | 0 refills | Status: DC

## 2020-06-22 ENCOUNTER — Ambulatory Visit: Payer: Self-pay | Attending: Nurse Practitioner | Admitting: Nurse Practitioner

## 2020-06-22 ENCOUNTER — Encounter: Payer: Self-pay | Admitting: Nurse Practitioner

## 2020-06-22 ENCOUNTER — Other Ambulatory Visit: Payer: Self-pay | Admitting: Nurse Practitioner

## 2020-06-22 ENCOUNTER — Other Ambulatory Visit: Payer: Self-pay

## 2020-06-22 VITALS — Ht 73.0 in | Wt 150.0 lb

## 2020-06-22 DIAGNOSIS — A549 Gonococcal infection, unspecified: Secondary | ICD-10-CM

## 2020-06-22 DIAGNOSIS — Z13228 Encounter for screening for other metabolic disorders: Secondary | ICD-10-CM

## 2020-06-22 DIAGNOSIS — N5089 Other specified disorders of the male genital organs: Secondary | ICD-10-CM

## 2020-06-22 DIAGNOSIS — Z13220 Encounter for screening for lipoid disorders: Secondary | ICD-10-CM

## 2020-06-22 DIAGNOSIS — Z131 Encounter for screening for diabetes mellitus: Secondary | ICD-10-CM

## 2020-06-22 DIAGNOSIS — A749 Chlamydial infection, unspecified: Secondary | ICD-10-CM

## 2020-06-22 DIAGNOSIS — Z7689 Persons encountering health services in other specified circumstances: Secondary | ICD-10-CM

## 2020-06-22 MED ORDER — METRONIDAZOLE 500 MG PO TABS: 500.0000 mg | ORAL_TABLET | Freq: Two times a day (BID) | ORAL | 0 refills | Status: DC

## 2020-06-22 MED ORDER — DOXYCYCLINE HYCLATE 100 MG PO CAPS: 100.0000 mg | ORAL_CAPSULE | Freq: Two times a day (BID) | ORAL | 0 refills | Status: AC

## 2020-06-22 MED FILL — ?METRONIDAZOLE 500 MG TABS: 500 | 7 days supply | Qty: 14 | Fill #0

## 2020-06-22 MED FILL — ?DOXYCYCLINE HYCLATE 100 MG: 100 | 7 days supply | Qty: 14 | Fill #0

## 2020-06-22 NOTE — Progress Notes (Signed)
Virtual Visit via Telephone Note Due to national recommendations of social distancing due to [place] 19, telehealth visit is felt to be most appropriate for this patient at this time.  I discussed the limitations, risks, security and privacy concerns of performing an evaluation and management service by telephone and the availability of in person appointments. I also discussed with the patient that there may be a patient responsible charge related to this service. The patient expressed understanding and agreed to proceed.    I connected with Cristopher Estimable on 06/22/20  at  10:30 AM EST  EDT by telephone and verified that I am speaking with the correct person using two identifiers.   Consent I discussed the limitations, risks, security and privacy concerns of performing an evaluation and management service by telephone and the availability of in person appointments. I also discussed with the patient that there may be a patient responsible charge related to this service. The patient expressed understanding and agreed to proceed.   Location of Patient: Private Residence   Location of Provider: [place] [facility] [place] participating in Telemedicine visit: Geryl Rankins FNP-BC [place]    History of Present Illness: Telemedicine visit for: Establish Care  He has a history of mild intermittent asthma. Today he is concerned about a testicular mass he noticed 2 weeks ago  He was evaluated in the emergency room for right scrotal swelling but no pain, discharge, drainage or any other GU symptoms. Urology was consulted and they agreed to reach out to patient to get an ultrasound done and follow-up.  Patient was instructed to call [facility] in a few days if he has not heard from their office.  Today he is upset because he could not get an appointment until March.  I did instruct the patient that there is only one clinic day on Thursdays for  noninsured patients and they would likely be booked for several weeks out and he should call back to schedule his appointment for March and request to be put on the waiting list if possible.  He is also aware that he tested positive for chlamydia and trichomonas on the 27th however he has still not picked up his medications.  I did offer to have him sent to the community clinic and he can speak to the pharmacist regarding a payment plan.  He did not seem to be too concerned about his STD and was more concerned about the way he was treated when he called [facility].  He is very angry today and states no one is listening to him or trying to find out what is wrong with him and he could have testicular cancer.  I have sent his medications to our pharmacy and he is aware that he also has blood work scheduled.  I did call my initial neurology and they stated that they had tried to call the patient however they were unable to leave a voicemail and he did not answer the phone and I will be glad to make him an appointment when he calls their office to schedule.  Past Medical History:  Diagnosis Date  . Asthma     Past Surgical History:  Procedure Laterality Date  . NO PAST SURGERIES      Family History  Problem Relation Age of Onset  . Healthy Mother   . Healthy Father   . Diabetes Neg Hx     Social History   Socioeconomic History  .  Marital status: Single    Spouse name: Not on file  . Number of children: Not on file  . Years of education: Not on file  . Highest education level: Not on file  Occupational History  . Not on file  Tobacco Use  . Smoking status: Current Every Day Smoker  . Smokeless tobacco: Former Network engineer and Sexual Activity  . Alcohol use: Yes  . Drug use: No  . Sexual activity: Not Currently  Other Topics Concern  . Not on file  Social History Narrative  . Not on file   Social Determinants of Health   Financial Resource Strain: Not on file  Food  Insecurity: Not on file  Transportation Needs: Not on file  Physical Activity: Not on file  Stress: Not on file  Social Connections: Not on file     Observations/Objective: Awake, alert and oriented x 3   Review of Systems  Constitutional: Negative for fever, malaise/fatigue and weight loss.  HENT: Negative.  Negative for nosebleeds.   Eyes: Negative.  Negative for blurred vision, double vision and photophobia.  Respiratory: Negative.  Negative for cough and shortness of breath.   Cardiovascular: Negative.  Negative for chest pain, palpitations and leg swelling.  Gastrointestinal: Negative.  Negative for heartburn, nausea and vomiting.  Genitourinary: Negative.        See HPI  Musculoskeletal: Negative.  Negative for myalgias.  Neurological: Negative.  Negative for dizziness, focal weakness, seizures and headaches.  Psychiatric/Behavioral: Negative.  Negative for suicidal ideas.    Assessment and Plan: Creighton was seen today for follow-up.  Diagnoses and all orders for this visit:  Encounter to establish care  Testicular mass -     CBC; Future -     PSA; Future  Encounter for screening for diabetes mellitus -     Hemoglobin A1c; Future  Lipid screening -     Lipid panel; Future  Gonorrhea -     doxycycline (VIBRAMYCIN) 100 MG capsule; Take 1 capsule (100 mg total) by mouth 2 (two) times daily for 7 days. NEEDS PASS/DOH -     metroNIDAZOLE (FLAGYL) 500 MG tablet; Take 1 tablet (500 mg total) by mouth 2 (two) times daily. NEEDS PASS/DOH  Chlamydia -     doxycycline (VIBRAMYCIN) 100 MG capsule; Take 1 capsule (100 mg total) by mouth 2 (two) times daily for 7 days. NEEDS PASS/DOH -     metroNIDAZOLE (FLAGYL) 500 MG tablet; Take 1 tablet (500 mg total) by mouth 2 (two) times daily. NEEDS PASS/DOH  Screening for metabolic disorder -     [id number]; Future     Follow Up Instructions Return if symptoms worsen or fail to improve.     I discussed the assessment and  treatment plan with the patient. The patient was provided an opportunity to ask questions and all were answered. The patient agreed with the plan and demonstrated an understanding of the instructions.   The patient was advised to call back or seek an in-person evaluation if the symptoms worsen or if the condition fails to improve as anticipated.  I provided 12 minutes of non-face-to-face time during this encounter including median intraservice time, reviewing previous notes, labs, imaging, medications and explaining diagnosis and management.  Gildardo Pounds, FNP-BC

## 2020-06-23 ENCOUNTER — Other Ambulatory Visit: Payer: Self-pay

## 2020-08-03 ENCOUNTER — Encounter: Payer: Self-pay | Admitting: Nurse Practitioner

## 2020-11-16 ENCOUNTER — Other Ambulatory Visit: Payer: Self-pay

## 2021-06-11 ENCOUNTER — Encounter (HOSPITAL_COMMUNITY): Payer: Self-pay | Admitting: *Deleted

## 2021-06-11 ENCOUNTER — Other Ambulatory Visit: Payer: Self-pay

## 2021-06-11 ENCOUNTER — Ambulatory Visit (HOSPITAL_COMMUNITY)
Admission: EM | Admit: 2021-06-11 | Discharge: 2021-06-11 | Disposition: A | Discharge status: Home / Self Care | Payer: Self-pay | Attending: Nurse Practitioner | Admitting: Nurse Practitioner

## 2021-06-11 DIAGNOSIS — Z1152 Encounter for screening for COVID-19: Secondary | ICD-10-CM | POA: Insufficient documentation

## 2021-06-11 DIAGNOSIS — J069 Acute upper respiratory infection, unspecified: Secondary | ICD-10-CM | POA: Insufficient documentation

## 2021-06-11 DIAGNOSIS — J4521 Mild intermittent asthma with (acute) exacerbation: Secondary | ICD-10-CM | POA: Insufficient documentation

## 2021-06-11 DIAGNOSIS — Z20822 Contact with and (suspected) exposure to covid-19: Secondary | ICD-10-CM | POA: Insufficient documentation

## 2021-06-11 MED ORDER — AZITHROMYCIN 250 MG PO TABS: 250.0000 mg | ORAL_TABLET | Freq: Every day | ORAL | 0 refills | Status: DC

## 2021-06-11 MED ORDER — GUAIFENESIN 100 MG/5ML PO LIQD: 100.0000 mg | ORAL | 0 refills | Status: DC | PRN

## 2021-06-11 MED ORDER — ALBUTEROL SULFATE HFA 108 (90 BASE) MCG/ACT IN AERS
2.0000 | INHALATION_SPRAY | Freq: Once | RESPIRATORY_TRACT | Status: AC
  Administered 2021-06-11: 2 via RESPIRATORY_TRACT

## 2021-06-11 MED ORDER — DEXAMETHASONE SODIUM PHOSPHATE 10 MG/ML IJ SOLN
10.0000 mg | Freq: Once | INTRAMUSCULAR | Status: AC
  Administered 2021-06-11: 10 mg via INTRAMUSCULAR

## 2021-06-11 MED ORDER — ALBUTEROL SULFATE HFA 108 (90 BASE) MCG/ACT IN AERS
INHALATION_SPRAY | RESPIRATORY_TRACT | Status: AC
  Filled 2021-06-11: qty 6.7

## 2021-06-11 MED ORDER — DEXAMETHASONE SODIUM PHOSPHATE 10 MG/ML IJ SOLN
INTRAMUSCULAR | Status: AC
  Filled 2021-06-11: qty 1

## 2021-06-11 MED ORDER — PREDNISONE 20 MG PO TABS: 40.0000 mg | ORAL_TABLET | Freq: Every day | ORAL | 0 refills | Status: AC

## 2021-06-11 NOTE — ED Triage Notes (Signed)
C/O asthma flare-up over past 3 days with sensation of wheezing. States does not have any inhalers.

## 2021-06-11 NOTE — ED Provider Notes (Signed)
[facility]    CSN: [id number] Arrival date & time: 06/11/21  1451      History   Chief Complaint Chief Complaint  Patient presents with   Asthma    HPI Richard Salinas is a 36 y.o. male.   Subjective:   Richard Salinas is a 36 y.o. male who presents for asthma exacerbation. The patient has been previously diagnosed with asthma. Symptoms currently include dyspnea, productive cough, wheezing and runny nose over the past 3-4 days. He denies any fevers, chills or body aches. No sore throat, nausea, vomiting, diarrhea or headache. Patient has missed 2 days of work in the last month due to his asthma. Patient hasn't tried anything for symptoms and does not have a rescue inhaler. He denies smoking or vaping. No second hand smoke exposure. He denies any sick contacts. No history of COVID. No COVID vaccine.   The following portions of the patient's history were reviewed and updated as appropriate: allergies, current medications, past family history, past medical history, past social history, past surgical history, and problem list.        Past Medical History:  Diagnosis Date   Asthma     There are no problems to display for this patient.   Past Surgical History:  Procedure Laterality Date   NO PAST SURGERIES         Home Medications    Prior to Admission medications   Medication Sig Start Date End Date Taking? Authorizing Provider  azithromycin (ZITHROMAX) 250 MG tablet Take 1 tablet (250 mg total) by mouth daily. Take first 2 tablets together, then 1 every day until finished. 06/11/21  Yes Lurline Idol, FNP  guaiFENesin (ROBITUSSIN) 100 MG/5ML liquid Take 5-10 mLs (100-200 mg total) by mouth every 4 (four) hours as needed for cough or to loosen phlegm. 06/11/21  Yes Lurline Idol, FNP  predniSONE (DELTASONE) 20 MG tablet Take 2 tablets (40 mg total) by mouth daily for 5 days. 06/11/21 06/16/21 Yes Lurline Idol, FNP    Family History Family History   Problem Relation Age of Onset   Healthy Mother    Healthy Father    Diabetes Neg Hx     Social History Social History   Tobacco Use   Smoking status: Never   Smokeless tobacco: Former  Building services engineer Use: Never used  Substance Use Topics   Alcohol use: Yes    Comment: occasionally   Drug use: No     Allergies   Patient has no known allergies.   Review of Systems Review of Systems  Constitutional:  Negative for chills, diaphoresis and fever.  HENT:  Positive for rhinorrhea. Negative for congestion.   Respiratory:  Positive for cough, shortness of breath and wheezing.   Gastrointestinal:  Negative for diarrhea, nausea and vomiting.  Musculoskeletal:  Negative for myalgias.  Neurological:  Negative for headaches.  All other systems reviewed and are negative.   Physical Exam Triage Vital Signs ED Triage Vitals  Enc Vitals Group     BP 06/11/21 1709 117/76     Pulse Rate 06/11/21 1709 92     Resp 06/11/21 1709 18     Temp 06/11/21 1709 99.1 F (37.3 C)     Temp Source 06/11/21 1709 Oral     SpO2 06/11/21 1709 96 %     Weight --      Height --      Head Circumference --      Peak Flow --  Pain Score 06/11/21 1710 0     Pain Loc --      Pain Edu? --      Excl. in GC? --    No data found.  Updated Vital Signs BP 117/76    Pulse 92    Temp 99.1 F (37.3 C) (Oral)    Resp 18    SpO2 96%   Visual Acuity Right Eye Distance:   Left Eye Distance:   Bilateral Distance:    Right Eye Near:   Left Eye Near:    Bilateral Near:     Physical Exam Vitals reviewed.  Constitutional:      General: He is not in acute distress.    Appearance: Normal appearance. He is not ill-appearing, toxic-appearing or diaphoretic.  HENT:     Head: Normocephalic.     Nose: Nose normal.  Eyes:     Conjunctiva/sclera: Conjunctivae normal.  Cardiovascular:     Rate and Rhythm: Normal rate and regular rhythm.  Pulmonary:     Effort: Pulmonary effort is normal. No  respiratory distress.     Breath sounds: Wheezing present.     Comments: Mild expiratory wheezing noted throughout Abdominal:     Palpations: Abdomen is soft.  Musculoskeletal:        General: Normal range of motion.     Cervical back: Normal range of motion and neck supple.  Lymphadenopathy:     Cervical: No cervical adenopathy.  Skin:    General: Skin is warm and dry.  Neurological:     General: No focal deficit present.     Mental Status: He is alert and oriented to person, place, and time.  Psychiatric:        Mood and Affect: Mood normal.     UC Treatments / Results  Labs (all labs ordered are listed, but only abnormal results are displayed) Labs Reviewed  SARS CORONAVIRUS 2 (TAT 6-24 HRS)    EKG   Radiology No results found.  Procedures Procedures (including critical care time)  Medications Ordered in UC Medications  albuterol (VENTOLIN HFA) 108 (90 Base) MCG/ACT inhaler 2 puff (has no administration in time range)  dexamethasone (DECADRON) injection 10 mg (has no administration in time range)    Initial Impression / Assessment and Plan / UC Course  I have reviewed the triage vital signs and the nursing notes.  Pertinent labs & imaging results that were available during my care of the patient were reviewed by me and considered in my medical decision making (see chart for details).    36 year old male with history of asthma presents with shortness of breath, productive cough, wheezing and runny nose over the past 3 to 4 days.  No fevers, chills, body aches, sore throat, nausea, vomiting, diarrhea or headache.  Tried anything for his symptoms.  He does not smoke/vape or have any secondhand smoke exposure.  Patient has low-grade fever of 99.1.  All other vitals are unremarkable.  Mild expiratory wheezing noted on exam.  Patient in no acute distress.  COVID test pending.  Decadron 10 mg IM given in clinic.  Albuterol inhaler x2 puffs administered in clinic.  Patient  allowed to take inhaler at home.  Z-Pak, prednisone and guaifenesin prescribed.  Patient to start these therapies on 06/12/2021 as current pharmacy is closed.  COVID precautions discussed.  Today's evaluation has revealed no signs of a dangerous process. Discussed diagnosis with patient and/or guardian. Patient and/or guardian aware of their diagnosis, possible red flag symptoms  to watch out for and need for close follow up. Patient and/or guardian understands verbal and written discharge instructions. Patient and/or guardian comfortable with plan and disposition.  Patient and/or guardian has a clear mental status at this time, good insight into illness (after discussion and teaching) and has clear judgment to make decisions regarding their care  This care was provided during an unprecedented National Emergency due to the Novel Coronavirus (COVID-19) pandemic. COVID-19 infections and transmission risks place heavy strains on healthcare resources.  As this pandemic evolves, our facility, providers, and staff strive to respond fluidly, to remain operational, and to provide care relative to available resources and information. Outcomes are unpredictable and treatments are without well-defined guidelines. Further, the impact of COVID-19 on all aspects of urgent care, including the impact to patients seeking care for reasons other than COVID-19, is unavoidable during this national emergency. At this time of the global pandemic, management of patients has significantly changed, even for non-COVID positive patients given high local and regional COVID volumes at this time requiring high healthcare system and resource utilization. The standard of care for management of both COVID suspected and non-COVID suspected patients continues to change rapidly at the local, regional, national, and global levels. This patient was worked up and treated to the best available but ever changing evidence and resources available at this  current time.   Documentation was completed with the aid of voice recognition software. Transcription may contain typographical errors. Final Clinical Impressions(s) / UC Diagnoses   Final diagnoses:  Mild intermittent asthma with exacerbation  Viral upper respiratory tract infection  Encounter for screening for COVID-19     Discharge Instructions      Take medications as prescribed. You may take tylenol or ibuprofen as needed for fevers/headache/body aches. Drink plenty of fluids. Stay in home isolation until you receive results of your COVID test. You will only be notified for positive results. You may go online to MyChart and review your results. Go to the ED immediately if you get worse or have any other concerning symptoms.        ED Prescriptions     Medication Sig Dispense Auth. Provider   azithromycin (ZITHROMAX) 250 MG tablet Take 1 tablet (250 mg total) by mouth daily. Take first 2 tablets together, then 1 every day until finished. 6 tablet Lurline IdolMurrill, Addaline Peplinski, FNP   predniSONE (DELTASONE) 20 MG tablet Take 2 tablets (40 mg total) by mouth daily for 5 days. 10 tablet Lurline IdolMurrill, Curlie Sittner, FNP   guaiFENesin (ROBITUSSIN) 100 MG/5ML liquid Take 5-10 mLs (100-200 mg total) by mouth every 4 (four) hours as needed for cough or to loosen phlegm. 60 mL Lurline IdolMurrill, Greycen Felter, FNP      PDMP not reviewed this encounter.   Lurline IdolMurrill, Margo Lama, [place]NP 06/11/21 1757

## 2021-06-11 NOTE — Discharge Instructions (Addendum)
Take medications as prescribed. You may take tylenol or ibuprofen as needed for fevers/headache/body aches. Drink plenty of fluids. Stay in home isolation until you receive results of your COVID test. You will only be notified for positive results. You may go online to [place] and review your results. Go to the ED immediately if you get worse or have any other concerning symptoms.

## 2021-06-12 LAB — SARS CORONAVIRUS 2 (TAT 6-24 HRS): SARS Coronavirus 2: NEGATIVE
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
## 2023-03-04 ENCOUNTER — Ambulatory Visit (HOSPITAL_COMMUNITY)
Admission: EM | Admit: 2023-03-04 | Discharge: 2023-03-04 | Disposition: A | Discharge status: Home / Self Care | Payer: Managed Care, Other (non HMO) | Attending: Family Medicine | Admitting: Family Medicine

## 2023-03-04 ENCOUNTER — Encounter (HOSPITAL_COMMUNITY): Payer: Self-pay

## 2023-03-04 DIAGNOSIS — N342 Other urethritis: Secondary | ICD-10-CM | POA: Insufficient documentation

## 2023-03-04 LAB — HIV ANTIBODY (ROUTINE TESTING W REFLEX): HIV Screen 4th Generation wRfx: NONREACTIVE

## 2023-03-04 NOTE — Discharge Instructions (Signed)
Staff will notify you if there is anything positive on the swab or on the blood work (we did blood test for HIV and syphilis)

## 2023-03-04 NOTE — ED Triage Notes (Signed)
Patient states he is hee for STD testing. Patient states he has been having a white penile drainage x 1 week.

## 2023-03-04 NOTE — ED Provider Notes (Signed)
[facility]    CSN: [id number] Arrival date & time: 03/04/23  1307      History   Chief Complaint Chief Complaint  Patient presents with   STD testing    HPI Arul Ramirez is a 37 y.o. male.   HPI Here for penile discharge is been going on for about 7 days.  No dysuria or itching.  No abdominal pain or fever or vomiting.  No allergies to medications.   Past Medical History:  Diagnosis Date   Asthma     There are no problems to display for this patient.   Past Surgical History:  Procedure Laterality Date   NO PAST SURGERIES         Home Medications    Prior to Admission medications   Not on File    Family History Family History  Problem Relation Age of Onset   Healthy Mother    Healthy Father    Diabetes Neg Hx     Social History Social History   Tobacco Use   Smoking status: Every Day    Types: Cigarettes, Cigars   Smokeless tobacco: Former  Building services engineer status: Some Days   Substances: Flavoring  Substance Use Topics   Alcohol use: Yes    Comment: occasionally   Drug use: No     Allergies   Patient has no known allergies.   Review of Systems Review of Systems   Physical Exam Triage Vital Signs ED Triage Vitals  Encounter Vitals Group     BP 03/04/23 1343 109/74     Systolic BP Percentile --      Diastolic BP Percentile --      Pulse Rate 03/04/23 1343 77     Resp 03/04/23 1343 16     Temp 03/04/23 1343 98.5 F (36.9 C)     Temp Source 03/04/23 1343 Oral     SpO2 03/04/23 1343 95 %     Weight --      Height --      Head Circumference --      Peak Flow --      Pain Score 03/04/23 1345 0     Pain Loc --      Pain Education --      Exclude from Growth Chart --    No data found.  Updated Vital Signs BP 109/74 (BP Location: Left Arm)   Pulse 77   Temp 98.5 F (36.9 C) (Oral)   Resp 16   SpO2 95%   Visual Acuity Right Eye Distance:   Left Eye Distance:   Bilateral Distance:    Right Eye  Near:   Left Eye Near:    Bilateral Near:     Physical Exam Vitals reviewed.  Constitutional:      General: He is not in acute distress.    Appearance: He is not toxic-appearing.  HENT:     Mouth/Throat:     Mouth: Mucous membranes are moist.  Skin:    Coloration: Skin is not pale.  Neurological:     Mental Status: He is alert and oriented to person, place, and time.  Psychiatric:        Behavior: Behavior normal.      UC Treatments / Results  Labs (all labs ordered are listed, but only abnormal results are displayed) Labs Reviewed  HIV ANTIBODY (ROUTINE TESTING W REFLEX)  RPR  CYTOLOGY, (ORAL, ANAL, URETHRAL) ANCILLARY ONLY    EKG   Radiology  No results found.  Procedures Procedures (including critical care time)  Medications Ordered in UC Medications - No data to display  Initial Impression / Assessment and Plan / UC Course  I have reviewed the triage vital signs and the nursing notes.  Pertinent labs & imaging results that were available during my care of the patient were reviewed by me and considered in my medical decision making (see chart for details).       He is amenable to having HIV and RPR checked. Urethral self swab is done. Staff will notify him of any positives and treat per protocol.  He is agreeable with awaiting the results of the swab before we did any treatment. Final Clinical Impressions(s) / UC Diagnoses   Final diagnoses:  Urethritis     Discharge Instructions      Staff will notify you if there is anything positive on the swab or on the blood work (we did blood test for HIV and syphilis)      ED Prescriptions   None    PDMP not reviewed this encounter.   Zenia Resides, MD 03/04/23 1425

## 2023-03-05 LAB — CYTOLOGY, (ORAL, ANAL, URETHRAL) ANCILLARY ONLY
Chlamydia: NEGATIVE
Comment: NEGATIVE
Comment: NEGATIVE
Comment: NORMAL
Neisseria Gonorrhea: NEGATIVE
Trichomonas: POSITIVE — AB

## 2023-03-05 LAB — RPR: RPR Ser Ql: NONREACTIVE

## 2023-03-06 ENCOUNTER — Telehealth: Payer: Self-pay

## 2023-03-06 MED ORDER — METRONIDAZOLE 500 MG PO TABS: 2000.0000 mg | ORAL_TABLET | Freq: Once | ORAL | 0 refills | Status: AC

## 2023-03-06 NOTE — Telephone Encounter (Signed)
Per protocol, pt requires tx with metronidazole. Attempted to reach patient x1. LVM.  Rx sent to pharmacy on file.

## 2024-01-21 ENCOUNTER — Encounter (HOSPITAL_COMMUNITY): Payer: Self-pay

## 2024-01-21 ENCOUNTER — Emergency Department (HOSPITAL_COMMUNITY)
Admission: EM | Admit: 2024-01-21 | Discharge: 2024-01-21 | Discharge status: Against Medical Advice | Payer: Self-pay | Attending: Emergency Medicine | Admitting: Emergency Medicine

## 2024-01-21 ENCOUNTER — Other Ambulatory Visit: Payer: Self-pay

## 2024-01-21 DIAGNOSIS — T40711A Poisoning by cannabis, accidental (unintentional), initial encounter: Secondary | ICD-10-CM | POA: Insufficient documentation

## 2024-01-21 DIAGNOSIS — Z5321 Procedure and treatment not carried out due to patient leaving prior to being seen by health care provider: Secondary | ICD-10-CM | POA: Insufficient documentation

## 2024-01-21 DIAGNOSIS — T50901A Poisoning by unspecified drugs, medicaments and biological substances, accidental (unintentional), initial encounter: Secondary | ICD-10-CM

## 2024-01-21 LAB — CBC WITH DIFFERENTIAL/PLATELET
Abs Immature Granulocytes: 0.03 K/uL (ref 0.00–0.07)
Basophils Absolute: 0 K/uL (ref 0.0–0.1)
Basophils Relative: 1 %
Eosinophils Absolute: 0 K/uL (ref 0.0–0.5)
Eosinophils Relative: 0 %
HCT: 43.1 % (ref 39.0–52.0)
Hemoglobin: 15.9 g/dL (ref 13.0–17.0)
Immature Granulocytes: 1 %
Lymphocytes Relative: 39 %
Lymphs Abs: 2.6 K/uL (ref 0.7–4.0)
MCH: 30.6 pg (ref 26.0–34.0)
MCHC: 36.9 g/dL — ABNORMAL HIGH (ref 30.0–36.0)
MCV: 83 fL (ref 80.0–100.0)
Monocytes Absolute: 0.5 K/uL (ref 0.1–1.0)
Monocytes Relative: 7 %
Neutro Abs: 3.5 K/uL (ref 1.7–7.7)
Neutrophils Relative %: 52 %
Platelets: 243 K/uL (ref 150–400)
RBC: 5.19 MIL/uL (ref 4.22–5.81)
RDW: 14.5 % (ref 11.5–15.5)
WBC: 6.6 K/uL (ref 4.0–10.5)
nRBC: 0 % (ref 0.0–0.2)

## 2024-01-21 LAB — BASIC METABOLIC PANEL WITH GFR
Anion gap: 11 (ref 5–15)
BUN: 14 mg/dL (ref 6–20)
CO2: 23 mmol/L (ref 22–32)
Calcium: 8.6 mg/dL — ABNORMAL LOW (ref 8.9–10.3)
Chloride: 104 mmol/L (ref 98–111)
Creatinine, Ser: 1.24 mg/dL (ref 0.61–1.24)
GFR, Estimated: >60 mL/min (ref >60)
Glucose, Bld: 106 mg/dL — ABNORMAL HIGH (ref 70–99)
Potassium: 4.1 mmol/L (ref 3.5–5.1)
Sodium: 138 mmol/L (ref 135–145)

## 2024-01-21 LAB — ETHANOL: Alcohol, Ethyl (B): 187 mg/dL — ABNORMAL HIGH (ref <15)

## 2024-01-21 NOTE — ED Notes (Signed)
 PT states he was leaving, RN went to find provider and when returned PT was seen leaving, provider aware and stable upon elopement
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
## 2024-01-22 LAB — RAPID URINE DRUG SCREEN, HOSP PERFORMED
Amphetamines: NOT DETECTED
Barbiturates: NOT DETECTED
Benzodiazepines: NOT DETECTED
Cocaine: NOT DETECTED
Opiates: NOT DETECTED
Tetrahydrocannabinol: POSITIVE — AB

## 2024-01-22 LAB — TROPONIN I (HIGH SENSITIVITY): Troponin I (High Sensitivity): 5 ng/L (ref <18)
